# Patient Record
Sex: Male | Born: 1950 | Race: Black or African American | Hispanic: No | Marital: Single | Smoking: Current some day smoker
Health system: Southern US, Community
[De-identification: ages and names within clinical notes are randomized; demographics above are authoritative.]

## PROBLEM LIST (undated history)

## (undated) DIAGNOSIS — I639 Cerebral infarction, unspecified: Secondary | ICD-10-CM

## (undated) DIAGNOSIS — I10 Essential (primary) hypertension: Secondary | ICD-10-CM

---

## 2008-08-17 ENCOUNTER — Encounter: Admission: RE | Admit: 2008-08-17 | Discharge: 2008-09-19 | Payer: Self-pay | Admitting: *Deleted

## 2008-10-17 ENCOUNTER — Encounter: Admission: RE | Admit: 2008-10-17 | Discharge: 2008-11-29 | Payer: Self-pay | Admitting: *Deleted

## 2012-10-21 ENCOUNTER — Ambulatory Visit: Payer: Non-veteran care | Attending: Family | Admitting: Physical Therapy

## 2012-10-21 DIAGNOSIS — R269 Unspecified abnormalities of gait and mobility: Secondary | ICD-10-CM | POA: Insufficient documentation

## 2012-10-21 DIAGNOSIS — IMO0001 Reserved for inherently not codable concepts without codable children: Secondary | ICD-10-CM | POA: Insufficient documentation

## 2012-10-21 DIAGNOSIS — M6281 Muscle weakness (generalized): Secondary | ICD-10-CM | POA: Insufficient documentation

## 2012-10-26 ENCOUNTER — Ambulatory Visit: Payer: Non-veteran care | Admitting: Physical Therapy

## 2012-10-28 ENCOUNTER — Ambulatory Visit: Payer: Non-veteran care | Admitting: Physical Therapy

## 2012-11-02 ENCOUNTER — Ambulatory Visit: Payer: Non-veteran care | Attending: *Deleted

## 2012-11-02 DIAGNOSIS — R269 Unspecified abnormalities of gait and mobility: Secondary | ICD-10-CM | POA: Insufficient documentation

## 2012-11-02 DIAGNOSIS — M6281 Muscle weakness (generalized): Secondary | ICD-10-CM | POA: Insufficient documentation

## 2012-11-02 DIAGNOSIS — IMO0001 Reserved for inherently not codable concepts without codable children: Secondary | ICD-10-CM | POA: Insufficient documentation

## 2012-11-04 ENCOUNTER — Ambulatory Visit: Payer: Non-veteran care | Admitting: Physical Therapy

## 2012-11-07 ENCOUNTER — Ambulatory Visit: Payer: Non-veteran care | Admitting: Physical Therapy

## 2012-11-09 ENCOUNTER — Ambulatory Visit: Payer: Non-veteran care | Admitting: Physical Therapy

## 2012-11-15 ENCOUNTER — Ambulatory Visit: Payer: Non-veteran care | Admitting: Physical Therapy

## 2012-11-18 ENCOUNTER — Ambulatory Visit: Payer: Non-veteran care | Admitting: Physical Therapy

## 2017-03-15 ENCOUNTER — Emergency Department (HOSPITAL_COMMUNITY): Payer: No Typology Code available for payment source

## 2017-03-15 ENCOUNTER — Emergency Department (HOSPITAL_COMMUNITY)
Admission: EM | Admit: 2017-03-15 | Discharge: 2017-03-15 | Disposition: A | Discharge status: Home / Self Care | Payer: No Typology Code available for payment source | Attending: Emergency Medicine | Admitting: Emergency Medicine

## 2017-03-15 ENCOUNTER — Other Ambulatory Visit: Payer: Self-pay

## 2017-03-15 ENCOUNTER — Encounter (HOSPITAL_COMMUNITY): Payer: Self-pay

## 2017-03-15 DIAGNOSIS — Y9389 Activity, other specified: Secondary | ICD-10-CM | POA: Diagnosis not present

## 2017-03-15 DIAGNOSIS — M545 Low back pain, unspecified: Secondary | ICD-10-CM

## 2017-03-15 DIAGNOSIS — Y998 Other external cause status: Secondary | ICD-10-CM | POA: Diagnosis not present

## 2017-03-15 DIAGNOSIS — M7918 Myalgia, other site: Secondary | ICD-10-CM

## 2017-03-15 DIAGNOSIS — F17200 Nicotine dependence, unspecified, uncomplicated: Secondary | ICD-10-CM | POA: Diagnosis not present

## 2017-03-15 DIAGNOSIS — M542 Cervicalgia: Secondary | ICD-10-CM | POA: Insufficient documentation

## 2017-03-15 DIAGNOSIS — I10 Essential (primary) hypertension: Secondary | ICD-10-CM | POA: Insufficient documentation

## 2017-03-15 DIAGNOSIS — Z8673 Personal history of transient ischemic attack (TIA), and cerebral infarction without residual deficits: Secondary | ICD-10-CM | POA: Diagnosis not present

## 2017-03-15 HISTORY — DX: Cerebral infarction, unspecified: I63.9

## 2017-03-15 HISTORY — DX: Essential (primary) hypertension: I10

## 2017-03-15 MED ORDER — HYDROCODONE-ACETAMINOPHEN 5-325 MG PO TABS: 1.0000 | ORAL_TABLET | ORAL | 0 refills | Status: DC | PRN
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
## 2017-03-15 NOTE — Discharge Instructions (Signed)
Your imaging today showed no evidence of fracture or dislocations.  We suspect you are having a muscular type pain.  Please use the pain medicine to help with your symptoms and follow-up with your PCP in several days.  If any symptoms change or worsen, please return to the nearest emergency department.
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
## 2017-03-15 NOTE — ED Notes (Signed)
Patient transported to X-ray 
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
## 2017-03-15 NOTE — ED Notes (Signed)
Patient transported to CT 

## 2017-03-15 NOTE — ED Notes (Signed)
Pt stable, ambulatory, states understanding of discharge instructions 

## 2017-05-19 ENCOUNTER — Emergency Department (HOSPITAL_COMMUNITY): Payer: Medicare HMO

## 2017-05-19 ENCOUNTER — Encounter (HOSPITAL_COMMUNITY): Payer: Self-pay

## 2017-05-19 ENCOUNTER — Emergency Department (HOSPITAL_COMMUNITY)
Admission: EM | Admit: 2017-05-19 | Discharge: 2017-05-19 | Disposition: A | Discharge status: Home / Self Care | Payer: Medicare HMO | Attending: Emergency Medicine | Admitting: Emergency Medicine

## 2017-05-19 ENCOUNTER — Other Ambulatory Visit: Payer: Self-pay

## 2017-05-19 DIAGNOSIS — F17200 Nicotine dependence, unspecified, uncomplicated: Secondary | ICD-10-CM | POA: Insufficient documentation

## 2017-05-19 DIAGNOSIS — R1032 Left lower quadrant pain: Secondary | ICD-10-CM | POA: Diagnosis present

## 2017-05-19 DIAGNOSIS — N201 Calculus of ureter: Secondary | ICD-10-CM | POA: Diagnosis not present

## 2017-05-19 LAB — COMPREHENSIVE METABOLIC PANEL
ALT: 22 U/L (ref 17–63)
AST: 21 U/L (ref 15–41)
Albumin: 3.8 g/dL (ref 3.5–5.0)
Alkaline Phosphatase: 73 U/L (ref 38–126)
Anion gap: 9 (ref 5–15)
BILIRUBIN TOTAL: 0.6 mg/dL (ref 0.3–1.2)
BUN: 16 mg/dL (ref 6–20)
CO2: 21 mmol/L — ABNORMAL LOW (ref 22–32)
Calcium: 9.3 mg/dL (ref 8.9–10.3)
Chloride: 106 mmol/L (ref 101–111)
Creatinine, Ser: 1.57 mg/dL — ABNORMAL HIGH (ref 0.61–1.24)
GFR calc Af Amer: 51 mL/min — ABNORMAL LOW (ref >60)
GFR, EST NON AFRICAN AMERICAN: 44 mL/min — AB (ref >60)
Glucose, Bld: 112 mg/dL — ABNORMAL HIGH (ref 65–99)
POTASSIUM: 5.1 mmol/L (ref 3.5–5.1)
Sodium: 136 mmol/L (ref 135–145)
TOTAL PROTEIN: 7.6 g/dL (ref 6.5–8.1)

## 2017-05-19 LAB — CBC
HEMATOCRIT: 40.6 % (ref 39.0–52.0)
Hemoglobin: 13.7 g/dL (ref 13.0–17.0)
MCH: 27.8 pg (ref 26.0–34.0)
MCHC: 33.7 g/dL (ref 30.0–36.0)
MCV: 82.4 fL (ref 78.0–100.0)
PLATELETS: 189 10*3/uL (ref 150–400)
RBC: 4.93 MIL/uL (ref 4.22–5.81)
RDW: 14.6 % (ref 11.5–15.5)
WBC: 8.4 10*3/uL (ref 4.0–10.5)

## 2017-05-19 LAB — URINALYSIS, ROUTINE W REFLEX MICROSCOPIC
BILIRUBIN URINE: NEGATIVE
GLUCOSE, UA: NEGATIVE mg/dL
Ketones, ur: NEGATIVE mg/dL
LEUKOCYTES UA: NEGATIVE
NITRITE: NEGATIVE
PH: 6 (ref 5.0–8.0)
Protein, ur: NEGATIVE mg/dL
SPECIFIC GRAVITY, URINE: 1.019 (ref 1.005–1.030)
Squamous Epithelial / LPF: NONE SEEN

## 2017-05-19 LAB — LIPASE, BLOOD: Lipase: 36 U/L (ref 11–51)

## 2017-05-19 MED ORDER — HYDROCODONE-ACETAMINOPHEN 5-325 MG PO TABS: 1.0000 | ORAL_TABLET | ORAL | 0 refills | Status: AC | PRN

## 2017-05-19 MED ORDER — ONDANSETRON HCL 4 MG/2ML IJ SOLN
4.0000 mg | Freq: Once | INTRAMUSCULAR | Status: AC
  Administered 2017-05-19: 4 mg via INTRAVENOUS
  Filled 2017-05-19: qty 2

## 2017-05-19 MED ORDER — SODIUM CHLORIDE 0.9 % IV BOLUS (SEPSIS)
500.0000 mL | Freq: Once | INTRAVENOUS | Status: AC
  Administered 2017-05-19: 500 mL via INTRAVENOUS

## 2017-05-19 MED ORDER — ONDANSETRON 8 MG PO TBDP: 8.0000 mg | ORAL_TABLET | Freq: Three times a day (TID) | ORAL | 0 refills | Status: AC | PRN

## 2017-05-19 MED ORDER — KETOROLAC TROMETHAMINE 30 MG/ML IJ SOLN
15.0000 mg | Freq: Once | INTRAMUSCULAR | Status: AC
  Administered 2017-05-19: 15 mg via INTRAVENOUS
  Filled 2017-05-19: qty 1

## 2017-05-19 NOTE — ED Triage Notes (Signed)
Pt presents to the ed with complaints of abdominal pain and nausea since this am. Pt has baseline left side weakness from previous stroke.

## 2017-05-19 NOTE — Discharge Instructions (Signed)
Please review the discharge instructions.  Take the medications as prescribed.  Follow-up with a urologist if your symptoms have not resolved in the next few days.

## 2017-05-19 NOTE — ED Provider Notes (Signed)
[facility] EMERGENCY DEPARTMENT Provider Note   CSN: [id number] Arrival date & time: 05/19/17  1124     History   Chief Complaint Chief Complaint  Patient presents with  . Abdominal Pain    HPI Andrew Calhoun is a 67 y.o. male.  HPI Pt started having pain in the abdomen when he woke up this am.  It is in the middle part of his lower abdomen and on the left side.  The pain is constant and aching.  The pain doesn't hurt when he urinates but when he tries to stop.  He has not been urinating more than usual and it dribbles out.  He wonders if he has an infection.  No vomiting or diarrhea but he does have nausea.   Past Medical History:  Diagnosis Date  . CVA (cerebral vascular accident) (HCC)   . Hypertension     There are no active problems to display for this patient.   History reviewed. No pertinent surgical history.     Home Medications    Prior to Admission medications   Medication Sig Start Date End Date Taking? Authorizing Provider  HYDROcodone-acetaminophen (NORCO/VICODIN) 5-325 MG tablet Take 1 tablet by mouth every 4 (four) hours as needed. 05/19/17   Linwood Dibbles, MD  ondansetron (ZOFRAN ODT) 8 MG disintegrating tablet Take 1 tablet (8 mg total) by mouth every 8 (eight) hours as needed for nausea or vomiting. 05/19/17   Linwood Dibbles, MD    Family History No family history on file.  Social History Social History   Tobacco Use  . Smoking status: Current Some Day Smoker  . Smokeless tobacco: Never Used  Substance Use Topics  . Alcohol use: No    Frequency: Never  . Drug use: No     Allergies   Patient has no known allergies.   Review of Systems Review of Systems  Constitutional: Positive for chills. Negative for fever.  Gastrointestinal: Positive for abdominal pain.  Genitourinary: Positive for dysuria.  All other systems reviewed and are negative.    Physical Exam Updated Vital Signs BP (!) 143/88   Pulse 85   Temp 98.6 F  (37 C) (Oral)   Resp 17   Wt 95.7 kg (211 lb)   SpO2 98%   BMI 34.06 kg/m   Physical Exam  Constitutional: He appears well-developed and well-nourished. No distress.  HENT:  Head: Normocephalic and atraumatic.  Right Ear: External ear normal.  Left Ear: External ear normal.  Eyes: Conjunctivae are normal. Right eye exhibits no discharge. Left eye exhibits no discharge. No scleral icterus.  Neck: Neck supple. No tracheal deviation present.  Cardiovascular: Normal rate, regular rhythm and intact distal pulses.  Pulmonary/Chest: Effort normal and breath sounds normal. No stridor. No respiratory distress. He has no wheezes. He has no rales.  Abdominal: Soft. Bowel sounds are normal. He exhibits no distension. There is tenderness (mild) in the suprapubic area and left lower quadrant. There is CVA tenderness. There is no rebound and no guarding.  Musculoskeletal: He exhibits no edema or tenderness.  Neurological: He is alert. He has normal strength. No cranial nerve deficit (no facial droop, extraocular movements intact, no slurred speech) or sensory deficit. He exhibits normal muscle tone. He displays no seizure activity. Coordination normal.  Skin: Skin is warm and dry. No rash noted.  Psychiatric: He has a normal mood and affect.  Nursing note and vitals reviewed.    ED Treatments / Results  Labs (all labs ordered  are listed, but only abnormal results are displayed) Labs Reviewed  COMPREHENSIVE METABOLIC PANEL - Abnormal; Notable for the following components:      Result Value   CO2 21 (*)    Glucose, Bld 112 (*)    Creatinine, Ser 1.57 (*)    GFR calc non Af Amer 44 (*)    GFR calc Af Amer 51 (*)    All other components within normal limits  URINALYSIS, ROUTINE W REFLEX MICROSCOPIC - Abnormal; Notable for the following components:   Hgb urine dipstick MODERATE (*)    Bacteria, UA RARE (*)    All other components within normal limits  LIPASE, BLOOD  CBC       Radiology Ct Renal Stone Study  Result Date: 05/19/2017 CLINICAL DATA:  LEFT flank pain, hematuria, suspected stone disease EXAM: CT ABDOMEN AND PELVIS WITHOUT CONTRAST TECHNIQUE: Multidetector CT imaging of the abdomen and pelvis was performed following the standard protocol without IV contrast. Sagittal and coronal MPR images reconstructed from axial data set. Oral contrast was not administered. COMPARISON:  None FINDINGS: Lower chest: Minimal dependent density at posterior RIGHT lung base Hepatobiliary: Gallbladder and liver normal appearance Pancreas: Normal appearance Spleen: Normal appearance Adrenals/Urinary Tract: Adrenal glands and RIGHT kidney normal appearance. LEFT hydronephrosis and hydroureter secondary to a 1-2 mm LEFT UVJ calculus. Kidneys and bladder otherwise normal appearance. Stomach/Bowel: Normal appendix. Question rectal wall thickening versus artifact from underdistention. Stomach and bowel loops otherwise unremarkable for technique. Vascular/Lymphatic: Extensive atherosclerotic calcifications aorta, iliac, and femoral arteries. Aorta normal caliber. Significant coronary arterial calcifications. Scattered pelvic phleboliths. No adenopathy. Reproductive: Unremarkable Other: Umbilical hernia containing fat.  No free air or free fluid. Musculoskeletal: No acute osseous findings. IMPRESSION: LEFT hydronephrosis and hydroureter secondary to a 1-2 mm LEFT UVJ calculus. Extensive coronary artery calcifications. Umbilical hernia containing fat. Questionable rectal wall thickening versus artifact from underdistention; consider correlation with proctoscopy. Aortic Atherosclerosis (ICD10-I70.0). Electronically Signed   By: Ulyses Southward M.D.   On: 05/19/2017 16:37    Procedures Procedures (including critical care time)  Medications Ordered in ED Medications  ketorolac (TORADOL) 30 MG/ML injection 15 mg (15 mg Intravenous Given 05/19/17 1549)  ondansetron (ZOFRAN) injection 4 mg (4 mg  Intravenous Given 05/19/17 1549)  sodium chloride 0.9 % bolus 500 mL (0 mLs Intravenous Stopped 05/19/17 1617)     Initial Impression / Assessment and Plan / ED Course  I have reviewed the triage vital signs and the nursing notes.  Pertinent labs & imaging results that were available during my care of the patient were reviewed by me and considered in my medical decision making (see chart for details).   Patient presented to the emergency room with complaints of lower abdominal pain and urinary frequency.  His laboratory tests were notable for mild elevation in his creatinine as well as hematuria.  Urinalysis did not suggest a urinary tract infection.  CT scan was performed demonstrating a 1-2 mm left ureteral stone.  Patient was treated with a low dose of Toradol and ondansetron.  His symptoms have improved.  Discussed the findings with the patient.  Plan on outpatient treatment with urology follow-up.  Final Clinical Impressions(s) / ED Diagnoses   Final diagnoses:  Left ureteral stone    ED Discharge Orders        Ordered    HYDROcodone-acetaminophen (NORCO/VICODIN) 5-325 MG tablet  Every 4 hours PRN     05/19/17 1744    ondansetron (ZOFRAN ODT) 8 MG disintegrating tablet  Every  8 hours PRN     05/19/17 1744       Linwood DibblesKnapp, Ilyse Tremain, MD 05/19/17 [id number]

## 2017-05-19 NOTE — ED Notes (Signed)
Pt discharged from ED; instructions provided and scripts given; Pt encouraged to return to ED if symptoms worsen and to f/u with PCP; Pt verbalized understanding of all instructions 

## 2017-05-21 ENCOUNTER — Telehealth: Payer: Self-pay | Admitting: *Deleted

## 2017-05-21 NOTE — Telephone Encounter (Signed)
Pharmacy called for diagnosis of pt before filling narcotic.

## 2018-07-12 ENCOUNTER — Telehealth: Payer: Self-pay | Admitting: General Practice

## 2018-07-12 NOTE — Telephone Encounter (Signed)
[facility], [facility] will [facility] for CNA home care aid to assist him with house cleaning, personal assistant . Pt stated " no further assistance with the paperwork , [facility] " .

## 2018-07-12 NOTE — Telephone Encounter (Signed)
Patient called and is trying to establish with Dr Ashley Royalty, He said he needs in-home care and needs paperwork filled out, he said he doesn't have access to anything to be able to do a video visit, how do you want to handle this?

## 2018-07-12 NOTE — Telephone Encounter (Signed)
What does he mean by in home care?  Would he be able to come into the office for a visit?  It also appears he is seen at the [place], does he have a primary there?
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
## 2018-08-30 DIAGNOSIS — R6889 Other general symptoms and signs: Secondary | ICD-10-CM | POA: Diagnosis not present

## 2018-11-02 DIAGNOSIS — R6889 Other general symptoms and signs: Secondary | ICD-10-CM | POA: Diagnosis not present

## 2018-11-22 DIAGNOSIS — R6889 Other general symptoms and signs: Secondary | ICD-10-CM | POA: Diagnosis not present

## 2019-01-03 DIAGNOSIS — R6889 Other general symptoms and signs: Secondary | ICD-10-CM | POA: Diagnosis not present

## 2019-03-20 DIAGNOSIS — R6889 Other general symptoms and signs: Secondary | ICD-10-CM | POA: Diagnosis not present

## 2019-07-25 DIAGNOSIS — R6889 Other general symptoms and signs: Secondary | ICD-10-CM | POA: Diagnosis not present

## 2020-05-08 DIAGNOSIS — R6889 Other general symptoms and signs: Secondary | ICD-10-CM | POA: Diagnosis not present

## 2022-11-18 ENCOUNTER — Other Ambulatory Visit: Payer: Self-pay

## 2022-11-18 ENCOUNTER — Emergency Department (HOSPITAL_COMMUNITY)
Admission: EM | Admit: 2022-11-18 | Discharge: 2022-11-19 | Disposition: A | Discharge status: Home / Self Care | Payer: No Typology Code available for payment source | Attending: Emergency Medicine | Admitting: Emergency Medicine

## 2022-11-18 ENCOUNTER — Emergency Department (HOSPITAL_COMMUNITY): Payer: No Typology Code available for payment source

## 2022-11-18 ENCOUNTER — Encounter (HOSPITAL_COMMUNITY): Payer: Self-pay | Admitting: Emergency Medicine

## 2022-11-18 DIAGNOSIS — I10 Essential (primary) hypertension: Secondary | ICD-10-CM | POA: Insufficient documentation

## 2022-11-18 DIAGNOSIS — N289 Disorder of kidney and ureter, unspecified: Secondary | ICD-10-CM | POA: Insufficient documentation

## 2022-11-18 DIAGNOSIS — R102 Pelvic and perineal pain: Secondary | ICD-10-CM

## 2022-11-18 DIAGNOSIS — Z79899 Other long term (current) drug therapy: Secondary | ICD-10-CM | POA: Insufficient documentation

## 2022-11-18 DIAGNOSIS — N200 Calculus of kidney: Secondary | ICD-10-CM | POA: Insufficient documentation

## 2022-11-18 DIAGNOSIS — R339 Retention of urine, unspecified: Secondary | ICD-10-CM | POA: Diagnosis present

## 2022-11-18 LAB — CBC WITH DIFFERENTIAL/PLATELET
Abs Immature Granulocytes: 0.01 10*3/uL (ref 0.00–0.07)
Basophils Absolute: 0.1 10*3/uL (ref 0.0–0.1)
Basophils Relative: 1 %
Eosinophils Absolute: 0.3 10*3/uL (ref 0.0–0.5)
Eosinophils Relative: 5 %
HCT: 41.9 % (ref 39.0–52.0)
Hemoglobin: 14 g/dL (ref 13.0–17.0)
Immature Granulocytes: 0 %
Lymphocytes Relative: 31 %
Lymphs Abs: 1.6 10*3/uL (ref 0.7–4.0)
MCH: 28.8 pg (ref 26.0–34.0)
MCHC: 33.4 g/dL (ref 30.0–36.0)
MCV: 86.2 fL (ref 80.0–100.0)
Monocytes Absolute: 0.6 10*3/uL (ref 0.1–1.0)
Monocytes Relative: 11 %
Neutro Abs: 2.7 10*3/uL (ref 1.7–7.7)
Neutrophils Relative %: 52 %
Platelets: 139 10*3/uL — ABNORMAL LOW (ref 150–400)
RBC: 4.86 MIL/uL (ref 4.22–5.81)
RDW: 14.6 % (ref 11.5–15.5)
WBC: 5.2 10*3/uL (ref 4.0–10.5)
nRBC: 0 % (ref 0.0–0.2)

## 2022-11-18 LAB — URINALYSIS, ROUTINE W REFLEX MICROSCOPIC
Bacteria, UA: NONE SEEN
Bilirubin Urine: NEGATIVE
Glucose, UA: NEGATIVE mg/dL
Hgb urine dipstick: NEGATIVE
Ketones, ur: 5 mg/dL — AB
Leukocytes,Ua: NEGATIVE
Nitrite: NEGATIVE
Protein, ur: 30 mg/dL — AB
Specific Gravity, Urine: 1.026 (ref 1.005–1.030)
pH: 5 (ref 5.0–8.0)

## 2022-11-18 LAB — COMPREHENSIVE METABOLIC PANEL
ALT: 15 U/L (ref 0–44)
AST: 22 U/L (ref 15–41)
Albumin: 3.8 g/dL (ref 3.5–5.0)
Alkaline Phosphatase: 77 U/L (ref 38–126)
Anion gap: 11 (ref 5–15)
BUN: 15 mg/dL (ref 8–23)
CO2: 20 mmol/L — ABNORMAL LOW (ref 22–32)
Calcium: 9.3 mg/dL (ref 8.9–10.3)
Chloride: 109 mmol/L (ref 98–111)
Creatinine, Ser: 1.5 mg/dL — ABNORMAL HIGH (ref 0.61–1.24)
GFR, Estimated: 49 mL/min — ABNORMAL LOW (ref >60)
Glucose, Bld: 119 mg/dL — ABNORMAL HIGH (ref 70–99)
Potassium: 3.8 mmol/L (ref 3.5–5.1)
Sodium: 140 mmol/L (ref 135–145)
Total Bilirubin: 0.4 mg/dL (ref 0.3–1.2)
Total Protein: 7.4 g/dL (ref 6.5–8.1)

## 2022-11-18 NOTE — ED Triage Notes (Signed)
Patient reports he has kidney stones and his urine is brown along with not emptying bladder fully.  Patient seen at [facility] and diagnosed with all of this.  Patient also endorses abd pain.

## 2022-11-19 NOTE — Discharge Instructions (Signed)
Stop taking tamsulosin, it may be causing some of your problems with urination.  Please follow-up with your urologist.  If symptoms do not improve over the next several days, they may want to start medicine to help with bladder spasms.  Return if you develop fever, vomiting, or severe pain.

## 2022-11-19 NOTE — ED Provider Notes (Signed)
[place] EMERGENCY DEPARTMENT AT [facility] Provider Note   CSN: [id number] Arrival date & time: 11/18/22  2149     History  Chief Complaint  Patient presents with   Urinary Retention    Andrew Calhoun is a 72 y.o. male.  The history is provided by the patient.  He has history of hypertension, stroke, and was recently treated by his urologist for kidney stones and started on tamsulosin.  Since then, he has noted urinary urgency and frequency and notices that he only urinates a very small amount.  He states at times he has had urge incontinence.  He is also complaining of some pain in the suprapubic area and excess flatus.  He denies fever or chills.  He denies any flank pain.  He states his urine has turned brownish and has an odor to it.   Home Medications Prior to Admission medications   Medication Sig Start Date End Date Taking? Authorizing Provider  HYDROcodone-acetaminophen (NORCO/VICODIN) 5-325 MG tablet Take 1 tablet by mouth every 4 (four) hours as needed. 05/19/17   Linwood Dibbles, MD  ondansetron (ZOFRAN ODT) 8 MG disintegrating tablet Take 1 tablet (8 mg total) by mouth every 8 (eight) hours as needed for nausea or vomiting. 05/19/17   Linwood Dibbles, MD      Allergies    Patient has no known allergies.    Review of Systems   Review of Systems  All other systems reviewed and are negative.   Physical Exam Updated Vital Signs BP 121/78   Pulse (!) 59   Temp 98.2 F (36.8 C) (Oral)   Resp 18   Wt 96.2 kg   SpO2 92%   BMI 34.22 kg/m  Physical Exam Vitals and nursing note reviewed.   72 year old male, resting comfortably and in no acute distress. Vital signs are significant for borderline slow heart rate. Oxygen saturation is 92%, which is normal. Head is normocephalic and atraumatic. PERRLA, EOMI. Oropharynx is clear. Neck is nontender and supple. Back is nontender and there is no CVA tenderness. Lungs are clear without rales, wheezes, or rhonchi. Chest  is nontender. Heart has regular rate and rhythm without murmur. Abdomen is soft, flat, with mild suprapubic tenderness.  There is no rebound or guarding. Extremities have no cyanosis or edema, full range of motion is present. Skin is warm and dry without rash. Neurologic: Mental status is normal, cranial nerves are intact, moves all extremities equally.  ED Results / Procedures / Treatments   Labs (all labs ordered are listed, but only abnormal results are displayed) Labs Reviewed  CBC WITH DIFFERENTIAL/PLATELET - Abnormal; Notable for the following components:      Result Value   Platelets 139 (*)    All other components within normal limits  COMPREHENSIVE METABOLIC PANEL - Abnormal; Notable for the following components:   CO2 20 (*)    Glucose, Bld 119 (*)    Creatinine, Ser 1.50 (*)    GFR, Estimated 49 (*)    All other components within normal limits  URINALYSIS, ROUTINE W REFLEX MICROSCOPIC - Abnormal; Notable for the following components:   Ketones, ur 5 (*)    Protein, ur 30 (*)    All other components within normal limits   Radiology CT Renal Stone Study  Result Date: 11/18/2022 CLINICAL DATA:  Abdominal/flank pain, urinary retention EXAM: CT ABDOMEN AND PELVIS WITHOUT CONTRAST TECHNIQUE: Multidetector CT imaging of the abdomen and pelvis was performed following the standard protocol without IV  contrast. RADIATION DOSE REDUCTION: This exam was performed according to the departmental dose-optimization program which includes automated exposure control, adjustment of the mA and/or kV according to patient size and/or use of iterative reconstruction technique. COMPARISON:  05/19/2017 FINDINGS: Lower chest: Lung bases are clear. Hepatobiliary: Unenhanced liver is unremarkable. Gallbladder sludge versus noncalcified gallstones (series 2/image 28), without associated inflammatory changes. No intrahepatic or extrahepatic duct dilatation. Pancreas: Within normal limits. Spleen: Within  normal limits. Adrenals/Urinary Tract: Adrenal glands are within normal limits. 4 mm nonobstructing right upper pole renal calculus (series 2/image 28). Left kidney is within normal limits. No hydronephrosis. Bladder is underdistended and poorly evaluated. Stomach/Bowel: Stomach is within normal limits. No evidence of bowel obstruction. Normal appendix (series 2/52). Scattered colonic diverticulosis, without evidence of diverticulitis. Vascular/Lymphatic: No evidence of abdominal aortic aneurysm. Atherosclerotic calcifications of the abdominal aorta and branch vessels. No suspicious abdominopelvic lymphadenopathy. Reproductive: Prostate is unremarkable. Other: No abdominopelvic ascites. Musculoskeletal: Degenerative changes of the visualized thoracolumbar spine. IMPRESSION: 4 mm nonobstructing right upper pole renal calculus. No hydronephrosis. Gallbladder sludge versus noncalcified gallstones, without associated inflammatory changes. Scattered colonic diverticulosis, without evidence of diverticulitis. Electronically Signed   By: Charline Bills M.D.   On: 11/18/2022 23:48    Procedures Procedures    Medications Ordered in ED Medications - No data to display  ED Course/ Medical Decision Making/ A&P                                 Medical Decision Making Amount and/or Complexity of Data Reviewed Labs: ordered. Radiology: ordered.   Urinary symptoms and lower abdominal pain in patient with recent history of kidney stone.  Consider renal colic from kidney stone, urinary tract infection, diverticulitis.  Doubt appendicitis.  I have reviewed his laboratory test, and my interpretation is stable renal insufficiency and stable elevated random glucose level, normal WBC and WBC differential, normal hemoglobin, mild thrombocytopenia which is not felt to be clinically significant.  Urinalysis is significant for mild proteinuria but no evidence of UTI, only 0-5 RBCs.  CT scan shows 4 mm nonobstructing right  upper pole renal calculus with no ureterolithiasis or hydronephrosis, diverticulosis without diverticulitis, gallbladder sludge without evidence of inflammation.  Bladder is noted to be contracted.  I have independently viewed the images, and agree with radiologist's interpretation.  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  Since symptoms started following initiation of tamsulosin, I am suspicious that that is causing his symptoms.  I am recommending that he discontinue tamsulosin and follow-up with his urologist.  He is reassured that there is no evidence of acute pathology based on labs and urinalysis and CT scan.  I have given him strict return precautions.  Final Clinical Impression(s) / ED Diagnoses Final diagnoses:  Suprapubic pain  Renal insufficiency  Right nephrolithiasis    Rx / DC Orders ED Discharge Orders     None         Dione Booze, MD 11/19/22 [phone]

## 2023-02-28 ENCOUNTER — Emergency Department (HOSPITAL_COMMUNITY)
Admission: EM | Admit: 2023-02-28 | Discharge: 2023-02-28 | Disposition: A | Discharge status: Home / Self Care | Payer: No Typology Code available for payment source | Attending: Emergency Medicine | Admitting: Emergency Medicine

## 2023-02-28 ENCOUNTER — Emergency Department (HOSPITAL_COMMUNITY): Payer: No Typology Code available for payment source

## 2023-02-28 ENCOUNTER — Encounter (HOSPITAL_COMMUNITY): Payer: Self-pay

## 2023-02-28 ENCOUNTER — Other Ambulatory Visit: Payer: Self-pay

## 2023-02-28 DIAGNOSIS — R531 Weakness: Secondary | ICD-10-CM | POA: Diagnosis not present

## 2023-02-28 DIAGNOSIS — D696 Thrombocytopenia, unspecified: Secondary | ICD-10-CM | POA: Insufficient documentation

## 2023-02-28 DIAGNOSIS — M79662 Pain in left lower leg: Secondary | ICD-10-CM

## 2023-02-28 DIAGNOSIS — I10 Essential (primary) hypertension: Secondary | ICD-10-CM | POA: Insufficient documentation

## 2023-02-28 DIAGNOSIS — M25572 Pain in left ankle and joints of left foot: Secondary | ICD-10-CM | POA: Insufficient documentation

## 2023-02-28 DIAGNOSIS — M79605 Pain in left leg: Secondary | ICD-10-CM | POA: Diagnosis not present

## 2023-02-28 LAB — BASIC METABOLIC PANEL
Anion gap: 12 (ref 5–15)
BUN: 17 mg/dL (ref 8–23)
CO2: 21 mmol/L — ABNORMAL LOW (ref 22–32)
Calcium: 9.4 mg/dL (ref 8.9–10.3)
Chloride: 104 mmol/L (ref 98–111)
Creatinine, Ser: 1.56 mg/dL — ABNORMAL HIGH (ref 0.61–1.24)
GFR, Estimated: 47 mL/min — ABNORMAL LOW (ref >60)
Glucose, Bld: 151 mg/dL — ABNORMAL HIGH (ref 70–99)
Potassium: 4.3 mmol/L (ref 3.5–5.1)
Sodium: 137 mmol/L (ref 135–145)

## 2023-02-28 LAB — CBC WITH DIFFERENTIAL/PLATELET
Abs Immature Granulocytes: 0.02 10*3/uL (ref 0.00–0.07)
Basophils Absolute: 0 10*3/uL (ref 0.0–0.1)
Basophils Relative: 1 %
Eosinophils Absolute: 0.1 10*3/uL (ref 0.0–0.5)
Eosinophils Relative: 2 %
HCT: 44.6 % (ref 39.0–52.0)
Hemoglobin: 14.4 g/dL (ref 13.0–17.0)
Immature Granulocytes: 0 %
Lymphocytes Relative: 15 %
Lymphs Abs: 1 10*3/uL (ref 0.7–4.0)
MCH: 27.6 pg (ref 26.0–34.0)
MCHC: 32.3 g/dL (ref 30.0–36.0)
MCV: 85.4 fL (ref 80.0–100.0)
Monocytes Absolute: 0.7 10*3/uL (ref 0.1–1.0)
Monocytes Relative: 11 %
Neutro Abs: 4.5 10*3/uL (ref 1.7–7.7)
Neutrophils Relative %: 71 %
Platelets: 126 10*3/uL — ABNORMAL LOW (ref 150–400)
RBC: 5.22 MIL/uL (ref 4.22–5.81)
RDW: 15.3 % (ref 11.5–15.5)
WBC: 6.4 10*3/uL (ref 4.0–10.5)
nRBC: 0 % (ref 0.0–0.2)

## 2023-02-28 LAB — CK: Total CK: 121 U/L (ref 49–397)

## 2023-02-28 MED ORDER — METHOCARBAMOL 500 MG PO TABS: 500.0000 mg | ORAL_TABLET | Freq: Two times a day (BID) | ORAL | 0 refills | Status: DC | PRN

## 2023-02-28 MED ORDER — HYDROCODONE-ACETAMINOPHEN 5-325 MG PO TABS
1.0000 | ORAL_TABLET | Freq: Once | ORAL | Status: AC
  Administered 2023-02-28: 1 via ORAL
  Filled 2023-02-28: qty 1

## 2023-02-28 MED ORDER — OXYCODONE-ACETAMINOPHEN 5-325 MG PO TABS
1.0000 | ORAL_TABLET | Freq: Once | ORAL | Status: AC
  Administered 2023-02-28: 1 via ORAL
  Filled 2023-02-28: qty 1

## 2023-02-28 MED ORDER — METHOCARBAMOL 500 MG PO TABS
500.0000 mg | ORAL_TABLET | Freq: Two times a day (BID) | ORAL | 0 refills | Status: DC | PRN
Start: 2023-02-28 — End: 2023-05-17

## 2023-02-28 NOTE — ED Triage Notes (Signed)
Complains of pain to left leg since stroke but had covid and flu shot the other day and since has been having pain in left leg worse. Brace in place due to stroke years ago.

## 2023-02-28 NOTE — ED Provider Notes (Signed)
[place] EMERGENCY DEPARTMENT AT [facility] Provider Note   CSN: [id number] Arrival date & time: 02/28/23  1242     History  Chief Complaint  Patient presents with   Leg Pain    Samari Spilsbury is a 72 y.o. male.   Leg Pain 72 year old male history of prior stroke with left leg weakness and hypertension presenting for left leg pain.  Patient states he has had pain and swelling to his left lateral ankle for several weeks.  His flu shot couple days ago and since that has had worsening pain.  No trauma or fall.  Pain is isolated to the lateral left ankle.  No swelling.  No erythema or fevers.  Is otherwise been at his baseline health.  No new weakness or numbness.     Home Medications Prior to Admission medications   Medication Sig Start Date End Date Taking? Authorizing Provider  HYDROcodone-acetaminophen (NORCO/VICODIN) 5-325 MG tablet Take 1 tablet by mouth every 4 (four) hours as needed. 05/19/17   Linwood Dibbles, MD  methocarbamol (ROBAXIN) 500 MG tablet Take 1 tablet (500 mg total) by mouth 2 (two) times daily as needed for muscle spasms. 02/28/23   Laurence Spates, MD  ondansetron (ZOFRAN ODT) 8 MG disintegrating tablet Take 1 tablet (8 mg total) by mouth every 8 (eight) hours as needed for nausea or vomiting. 05/19/17   Linwood Dibbles, MD      Allergies    Patient has no known allergies.    Review of Systems   Review of Systems Review of systems completed and notable as per HPI.  ROS otherwise negative.   Physical Exam Updated Vital Signs BP 103/69 (BP Location: Right Arm)   Pulse 67   Temp 97.8 F (36.6 C)   Resp 16   Ht 5\' 6"  (1.676 m)   Wt 96.2 kg   SpO2 96%   BMI 34.22 kg/m  Physical Exam Vitals and nursing note reviewed.  Constitutional:      General: He is not in acute distress.    Appearance: He is well-developed.  HENT:     Head: Normocephalic and atraumatic.  Eyes:     Conjunctiva/sclera: Conjunctivae normal.  Cardiovascular:     Rate and  Rhythm: Normal rate and regular rhythm.     Pulses: Normal pulses.     Heart sounds: Normal heart sounds. No murmur heard. Pulmonary:     Effort: Pulmonary effort is normal. No respiratory distress.     Breath sounds: Normal breath sounds.  Abdominal:     Palpations: Abdomen is soft.     Tenderness: There is no abdominal tenderness.  Musculoskeletal:        General: No swelling.     Cervical back: Neck supple.     Comments: Mild tenderness over the lateral aspect of the left ankle with mild swelling of the left ankle and calf.  No skin changes.  No erythema or warmth.  Mild pain with range of motion of left ankle.  Palpable DP and PT pulse.  Sensation is intact.  Baseline weakness.  Skin:    General: Skin is warm and dry.     Capillary Refill: Capillary refill takes less than 2 seconds.  Neurological:     Mental Status: He is alert and oriented to person, place, and time. Mental status is at baseline.     Cranial Nerves: No cranial nerve deficit.     Sensory: No sensory deficit.     Coordination: Coordination normal.  Comments: Baseline weakness in the left leg.  Psychiatric:        Mood and Affect: Mood normal.     ED Results / Procedures / Treatments   Labs (all labs ordered are listed, but only abnormal results are displayed) Labs Reviewed  CBC WITH DIFFERENTIAL/PLATELET - Abnormal; Notable for the following components:      Result Value   Platelets 126 (*)    All other components within normal limits  BASIC METABOLIC PANEL - Abnormal; Notable for the following components:   CO2 21 (*)    Glucose, Bld 151 (*)    Creatinine, Ser 1.56 (*)    GFR, Estimated 47 (*)    All other components within normal limits  CK    EKG None  Radiology VAS [place] LOWER EXTREMITY VENOUS (DVT) (7a-7p) Result Date: 02/28/2023  Lower Venous DVT Study Patient Name:  Timoth Machida  Date of Exam:   02/28/2023 Medical Rec #: [id number]     Accession #:    [id number] Date of Birth: 02-07-1951       Patient Gender: M Patient Age:   50 years Exam Location:  [facility] Procedure:      VAS [place] LOWER EXTREMITY VENOUS (DVT) Referring Phys: Marja Kays Eldred Sooy --------------------------------------------------------------------------------  Indications: Increased pain to left calf since receiving COVID and Flu vaccines 2 days ago. Patient has history of CVA with residual left side weakness and wears brace on left leg.  Limitations: Poor ultrasound/tissue interface in calf area. Comparison Study: No prior study on file Performing Technologist: Sherren Kerns RVS  Examination Guidelines: A complete evaluation includes B-mode imaging, spectral Doppler, color Doppler, and power Doppler as needed of all accessible portions of each vessel. Bilateral testing is considered an integral part of a complete examination. Limited examinations for reoccurring indications may be performed as noted. The reflux portion of the exam is performed with the patient in reverse Trendelenburg.  +-----+---------------+---------+-----------+----------+--------------+ RIGHTCompressibilityPhasicitySpontaneityPropertiesThrombus Aging +-----+---------------+---------+-----------+----------+--------------+ CFV  Full           Yes      Yes                                 +-----+---------------+---------+-----------+----------+--------------+   +---------+---------------+---------+-----------+----------+-------------------+ LEFT     CompressibilityPhasicitySpontaneityPropertiesThrombus Aging      +---------+---------------+---------+-----------+----------+-------------------+ CFV      Full           Yes      Yes                                      +---------+---------------+---------+-----------+----------+-------------------+ SFJ      Full                                                             +---------+---------------+---------+-----------+----------+-------------------+ FV Prox  Full                                                              +---------+---------------+---------+-----------+----------+-------------------+ FV  Mid   Full                                                             +---------+---------------+---------+-----------+----------+-------------------+ FV DistalFull                                                             +---------+---------------+---------+-----------+----------+-------------------+ PFV      Full                                                             +---------+---------------+---------+-----------+----------+-------------------+ POP      Full           Yes      Yes                                      +---------+---------------+---------+-----------+----------+-------------------+ PTV                                                   patent by color     +---------+---------------+---------+-----------+----------+-------------------+ PERO                                                  Not well visualized +---------+---------------+---------+-----------+----------+-------------------+ Patent distal posterior tibial, dorsalis pedis, and peroneal arteries   Summary: RIGHT: - No evidence of common femoral vein obstruction.   LEFT: - There is no evidence of deep vein thrombosis in the lower extremity.  *See table(s) above for measurements and observations.    Preliminary    DG Foot Complete Left Result Date: 02/28/2023 CLINICAL DATA:  Left foot pain.  No known injury. EXAM: LEFT FOOT - COMPLETE 3+ VIEW COMPARISON:  None Available. FINDINGS: There is no evidence of fracture or dislocation. There is no evidence of arthropathy or other focal bone abnormality. Soft tissues are unremarkable. IMPRESSION: Negative. Electronically Signed   By: Danae Orleans M.D.   On: 02/28/2023 15:17   DG Ankle Complete Left Result Date: 02/28/2023 CLINICAL DATA:  Left ankle pain and swelling.  No known injury. EXAM: LEFT ANKLE COMPLETE -  3+ VIEW COMPARISON:  None Available. FINDINGS: There is no evidence of fracture, dislocation, or joint effusion. There is no evidence of arthropathy or other focal bone lesions. Generalized osteopenia noted. Lateral soft tissue swelling is seen. Peripheral vascular calcification also noted. IMPRESSION: Lateral soft tissue swelling. No acute fracture or dislocation. Osteopenia. Peripheral vascular disease. Electronically Signed   By: Danae Orleans M.D.   On: 02/28/2023 15:16    Procedures Procedures    Medications Ordered in ED  Medications  HYDROcodone-acetaminophen (NORCO/VICODIN) 5-325 MG per tablet 1 tablet (has no administration in time range)  oxyCODONE-acetaminophen (PERCOCET/ROXICET) 5-325 MG per tablet 1 tablet (1 tablet Oral Given 02/28/23 1358)    ED Course/ Medical Decision Making/ A&P                                 Medical Decision Making Risk Prescription drug management.   Medical Decision Making:   Raffi Slisz is a 72 y.o. male who presented to the ED today with several weeks of left-sided leg pain and swelling that worsened a couple days ago after getting his shots.  On exam he has baseline weakness, some tenderness over the lateral aspect the ankle and mild swelling here.  He has not had any trauma.  He is no skin changes or signs of cellulitis or septic arthritis or osteomyelitis.  Will evaluate with ultrasound for possible DVT given limited mobility and swelling here.  Labs notable for baseline thrombocytopenia and baseline renal function.  Reviewed and confirmed nursing documentation for past medical history, family history, social history. Reassessment and Plan:   X-ray without fracture.  Ultrasound tech notified that there is no signs of vascular abnormality no signs of DVT or arterial abnormality of the left leg.  On reassessment pain is slightly improved.  Request additional pain medication.  He wants to try muscle relaxer.  I counseled him on the risks of fall  and drowsiness with this.  He would like to try it.  Instructed not to drive while taking this.  He is niece is able to take him home.  Recommend follow-up closely with PCP.  Return precautions given.   Patient's presentation is most consistent with acute complicated illness / injury requiring diagnostic workup.           Final Clinical Impression(s) / ED Diagnoses Final diagnoses:  Acute left ankle pain    Rx / DC Orders ED Discharge Orders          Ordered    methocarbamol (ROBAXIN) 500 MG tablet  2 times daily PRN,   Status:  Discontinued        02/28/23 2010    methocarbamol (ROBAXIN) 500 MG tablet  2 times daily PRN,   Status:  Discontinued        02/28/23 2011    methocarbamol (ROBAXIN) 500 MG tablet  2 times daily PRN        02/28/23 2013              Laurence Spates, MD 02/28/23 2018

## 2023-02-28 NOTE — Progress Notes (Addendum)
VASCULAR LAB    Left lower extremity venous duplex has been performed.  See CV proc for preliminary results.  Gave verbal report to Dr .Ozella Rocks, [facility], RVT 02/28/2023, 7:36 PM

## 2023-02-28 NOTE — ED Provider Triage Note (Cosign Needed)
Emergency Medicine Provider Triage Evaluation Note  Andrew Calhoun , a 72 y.o. male  was evaluated in triage.  Pt complains of L leg pain after getting COVID/flu shot 2 days ago. Is affecting leg that was affected by stroke.   Review of Systems  Positive: Leg pain Negative: fevers  Physical Exam  BP (!) 140/129   Pulse 82   Temp 97.8 F (36.6 C)   Resp 16   SpO2 99%  Gen:   Awake, no distress   Resp:  Normal effort  MSK:   Moves extremities without difficulty  Other:  +TTP of  L ankle and heel  Medical Decision Making  Medically screening exam initiated at 1:43 PM.  Appropriate orders placed.  Ladarren Estime was informed that the remainder of the evaluation will be completed by another provider, this initial triage assessment does not replace that evaluation, and the importance of remaining in the ED until their evaluation is complete.     Pete Pelt, [place] 02/28/23 1344

## 2023-05-17 ENCOUNTER — Emergency Department (HOSPITAL_COMMUNITY)
Admission: EM | Admit: 2023-05-17 | Discharge: 2023-05-18 | Disposition: A | Discharge status: Home / Self Care | Attending: Emergency Medicine | Admitting: Emergency Medicine

## 2023-05-17 ENCOUNTER — Other Ambulatory Visit: Payer: Self-pay

## 2023-05-17 ENCOUNTER — Emergency Department (HOSPITAL_COMMUNITY)

## 2023-05-17 DIAGNOSIS — R531 Weakness: Secondary | ICD-10-CM | POA: Insufficient documentation

## 2023-05-17 DIAGNOSIS — M25562 Pain in left knee: Secondary | ICD-10-CM | POA: Diagnosis not present

## 2023-05-17 DIAGNOSIS — I69398 Other sequelae of cerebral infarction: Secondary | ICD-10-CM | POA: Insufficient documentation

## 2023-05-17 DIAGNOSIS — M62422 Contracture of muscle, left upper arm: Secondary | ICD-10-CM | POA: Diagnosis not present

## 2023-05-17 DIAGNOSIS — I10 Essential (primary) hypertension: Secondary | ICD-10-CM | POA: Insufficient documentation

## 2023-05-17 LAB — CBC WITH DIFFERENTIAL/PLATELET
Abs Immature Granulocytes: 0.03 10*3/uL (ref 0.00–0.07)
Basophils Absolute: 0 10*3/uL (ref 0.0–0.1)
Basophils Relative: 0 %
Eosinophils Absolute: 0.1 10*3/uL (ref 0.0–0.5)
Eosinophils Relative: 1 %
HCT: 42.2 % (ref 39.0–52.0)
Hemoglobin: 13.6 g/dL (ref 13.0–17.0)
Immature Granulocytes: 0 %
Lymphocytes Relative: 13 %
Lymphs Abs: 1.3 10*3/uL (ref 0.7–4.0)
MCH: 27.4 pg (ref 26.0–34.0)
MCHC: 32.2 g/dL (ref 30.0–36.0)
MCV: 85.1 fL (ref 80.0–100.0)
Monocytes Absolute: 1.1 10*3/uL — ABNORMAL HIGH (ref 0.1–1.0)
Monocytes Relative: 12 %
Neutro Abs: 7.3 10*3/uL (ref 1.7–7.7)
Neutrophils Relative %: 74 %
Platelets: 124 10*3/uL — ABNORMAL LOW (ref 150–400)
RBC: 4.96 MIL/uL (ref 4.22–5.81)
RDW: 15.6 % — ABNORMAL HIGH (ref 11.5–15.5)
WBC: 9.9 10*3/uL (ref 4.0–10.5)
nRBC: 0 % (ref 0.0–0.2)

## 2023-05-17 LAB — BASIC METABOLIC PANEL
Anion gap: 10 (ref 5–15)
BUN: 17 mg/dL (ref 8–23)
CO2: 21 mmol/L — ABNORMAL LOW (ref 22–32)
Calcium: 8.6 mg/dL — ABNORMAL LOW (ref 8.9–10.3)
Chloride: 107 mmol/L (ref 98–111)
Creatinine, Ser: 1.33 mg/dL — ABNORMAL HIGH (ref 0.61–1.24)
GFR, Estimated: 57 mL/min — ABNORMAL LOW (ref >60)
Glucose, Bld: 162 mg/dL — ABNORMAL HIGH (ref 70–99)
Potassium: 3.6 mmol/L (ref 3.5–5.1)
Sodium: 138 mmol/L (ref 135–145)

## 2023-05-17 LAB — CK: Total CK: 724 U/L — ABNORMAL HIGH (ref 49–397)

## 2023-05-17 MED ORDER — METHOCARBAMOL 500 MG PO TABS: 500.0000 mg | ORAL_TABLET | Freq: Two times a day (BID) | ORAL | 0 refills | Status: AC

## 2023-05-17 MED ORDER — SODIUM CHLORIDE 0.9 % IV BOLUS
1000.0000 mL | Freq: Once | INTRAVENOUS | Status: AC
  Administered 2023-05-17: 1000 mL via INTRAVENOUS

## 2023-05-17 MED ORDER — METHOCARBAMOL 500 MG PO TABS
500.0000 mg | ORAL_TABLET | Freq: Once | ORAL | Status: AC
  Administered 2023-05-17: 500 mg via ORAL
  Filled 2023-05-17: qty 1

## 2023-05-17 NOTE — ED Provider Notes (Signed)
 Patient continues to ask to be discharged.  His labs have finally resulted.  CK not consistent with rhabdo. BMP about baseline.     Roxy Horseman, PA-C 05/18/23 0450    Sabas Sous, MD 05/18/23 [phone]

## 2023-05-17 NOTE — ED Triage Notes (Signed)
 Patient brought in by [facility] for generalized weakness x 3-4 days. Patient unable to get off couch to ambulate, crawling to bathroom to defecate. Left side deficit from previous stroke, uses cane. No falls, no injury.   Nurse aide sat home feeding and offering patient drink. Alert and oriented x4  136/70 CBG 133

## 2023-05-17 NOTE — Discharge Instructions (Signed)
 Your blood work looked good.  Please be certain to stay hydrated.

## 2023-05-17 NOTE — ED Provider Notes (Signed)
 [place] EMERGENCY DEPARTMENT AT [address] [facility] Provider Note   CSN: [id number] Arrival date & time: 05/17/23  1150     History  Chief Complaint  Patient presents with   Weakness    Andrew Calhoun is a 73 y.o. male.  With history of hypertension, previous CVA with left-sided deficit presenting to the ED for evaluation of generalized weakness.  Symptoms began 3 to 4 days ago.  He has had some difficulty ambulating and says he has been crawling to the bathroom.  He denies any falls or recent injuries.  He states for the past 1.5 days he has not been eating or drinking much.  He states when he tries to get up from the couch, his left leg which is his leg affected by the stroke begins to shake and when he places weight on the leg the leg gives out.  He reports some moderate pain to the left knee.  He denies fevers or chills, cough, chest pain, shortness of breath, abdominal pain.  He reports some urinary frequency but denies dysuria or urgency.   Weakness Associated symptoms: arthralgias        Home Medications Prior to Admission medications   Medication Sig Start Date End Date Taking? Authorizing Provider  HYDROcodone-acetaminophen (NORCO/VICODIN) 5-325 MG tablet Take 1 tablet by mouth every 4 (four) hours as needed. 05/19/17   Linwood Dibbles, MD  methocarbamol (ROBAXIN) 500 MG tablet Take 1 tablet (500 mg total) by mouth 2 (two) times daily as needed for muscle spasms. 02/28/23   Laurence Spates, MD  ondansetron (ZOFRAN ODT) 8 MG disintegrating tablet Take 1 tablet (8 mg total) by mouth every 8 (eight) hours as needed for nausea or vomiting. 05/19/17   Linwood Dibbles, MD      Allergies    Patient has no known allergies.    Review of Systems   Review of Systems  Musculoskeletal:  Positive for arthralgias.  Neurological:  Positive for weakness.  All other systems reviewed and are negative.   Physical Exam Updated Vital Signs BP 128/77   Pulse 86   Temp 97.8 F (36.6 C)  (Oral)   Resp 18   Ht 5\' 4"  (1.626 m)   Wt 87.1 kg   SpO2 98%   BMI 32.96 kg/m  Physical Exam Vitals and nursing note reviewed.  Constitutional:      General: He is not in acute distress.    Appearance: Normal appearance. He is normal weight. He is not ill-appearing.     Comments: Resting comfortably in bed  HENT:     Head: Normocephalic and atraumatic.  Cardiovascular:     Rate and Rhythm: Normal rate and regular rhythm.  Pulmonary:     Effort: Pulmonary effort is normal. No respiratory distress.     Breath sounds: No wheezing, rhonchi or rales.  Abdominal:     General: Abdomen is flat.  Musculoskeletal:        General: Normal range of motion.     Cervical back: Neck supple.     Comments: LUE contracture, LLE with brace.  Mild TTP of left knee without deformity, overlying erythema, joint space laxity.  Compartments are soft.  Skin:    General: Skin is warm and dry.  Neurological:     Mental Status: He is alert and oriented to person, place, and time.  Psychiatric:        Mood and Affect: Mood normal.        Behavior: Behavior normal.  ED Results / Procedures / Treatments   Labs (all labs ordered are listed, but only abnormal results are displayed) Labs Reviewed  BASIC METABOLIC PANEL  CBC WITH DIFFERENTIAL/PLATELET  CK    EKG None  Radiology CT Head Wo Contrast Result Date: 05/17/2023 CLINICAL DATA:  Neuro deficit, concern for stroke. EXAM: CT HEAD WITHOUT CONTRAST TECHNIQUE: Contiguous axial images were obtained from the base of the skull through the vertex without intravenous contrast. RADIATION DOSE REDUCTION: This exam was performed according to the departmental dose-optimization program which includes automated exposure control, adjustment of the mA and/or kV according to patient size and/or use of iterative reconstruction technique. COMPARISON:  None Available. FINDINGS: Brain: No acute intracranial hemorrhage. No CT evidence of acute infarct. Nonspecific  hypoattenuation in the periventricular and subcortical white matter favored to reflect chronic microvascular ischemic changes. Remote infarct involving the right corona radiata and basal ganglia. Additional encephalomalacia in the anterior inferior right frontal lobe, posttraumatic versus prior infarct. Volume loss in the right frontal lobe possibly related to prior infarct. No edema, mass effect, or midline shift. The basilar cisterns are patent. Ventricles: Ex vacuo dilatation of the right lateral ventricle. No hydrocephalus. Vascular: Atherosclerotic calcifications of the carotid siphons. No hyperdense vessel. Skull: No acute or aggressive finding. Orbits: Orbits are symmetric. Sinuses: Scattered mucosal thickening throughout the paranasal sinuses most pronounced in the ethmoid sinuses. No air-fluid levels noted. Other: Mastoid air cells are clear. IMPRESSION: No CT evidence of acute intracranial abnormality. Remote infarct in the right corona radiata and right basal ganglia. Additional encephalomalacia in the anterior inferior right frontal lobe, posttraumatic versus prior infarct. Mild-to-moderate chronic microvascular ischemic changes. Electronically Signed   By: Emily Filbert M.D.   On: 05/17/2023 15:36   DG Chest 1 View Result Date: 05/17/2023 CLINICAL DATA:  MC-DGpain, inability to bear weight, weakness EXAM: CHEST  1 VIEW COMPARISON:  None Available. FINDINGS: Normal mediastinum and cardiac silhouette. Normal pulmonary vasculature. No evidence of effusion, infiltrate, or pneumothorax. No acute bony abnormality. IMPRESSION: No acute cardiopulmonary process. Electronically Signed   By: Genevive Bi M.D.   On: 05/17/2023 15:03   DG Knee Complete 4 Views Left Result Date: 05/17/2023 CLINICAL DATA:  Pain, weakness, inability to bear weight EXAM: LEFT KNEE - COMPLETE 4+ VIEW COMPARISON:  None Available. FINDINGS: Frontal, bilateral oblique, and lateral views of the left knee are obtained. No acute  fracture, subluxation, or dislocation. Joint spaces are relatively well preserved. No joint effusion. Extensive atherosclerosis. Soft tissues are unremarkable. IMPRESSION: 1. Unremarkable left knee. 2. Extensive atherosclerosis. Electronically Signed   By: Sharlet Salina M.D.   On: 05/17/2023 15:00    Procedures Procedures    Medications Ordered in ED Medications  sodium chloride 0.9 % bolus 1,000 mL (has no administration in time range)    ED Course/ Medical Decision Making/ A&P Clinical Course as of 05/17/23 1542  Mon May 17, 2023  1528 L leg weakness hx stroke L deficits, can usually ambulate with cane, now not able - shakey and no ambulate - crawls, decrease oral intake [ ]  CT head [ ]  PT/OT eval in ED [HG]    Clinical Course User Index [HG] Renella Cunas, MD                                 Medical Decision Making Amount and/or Complexity of Data Reviewed Labs: ordered. Radiology: ordered.  This patient presents to the ED for  concern of LLE weakness, this involves an extensive number of treatment options, and is a complaint that carries with it a high risk of complications and morbidity.  The differential diagnosis of weakness includes but is not limited to neurologic causes (GBS, myasthenia gravis, CVA, MS, ALS, transverse myelitis, spinal cord injury, CVA, botulism, ) and other causes: ACS, Arrhythmia, syncope, orthostatic hypotension, sepsis, hypoglycemia, electrolyte disturbance, hypothyroidism, respiratory failure, symptomatic anemia, dehydration, heat injury, polypharmacy, malignancy.   My initial workup includes labs, imaging, fluids  Additional history obtained from: Nursing notes from this visit.  I ordered, reviewed and interpreted labs which include: CBC, BMP, CK  I ordered imaging studies including x-ray left knee, chest I independently visualized and interpreted imaging which are pending at shift change  Afebrile, hemodynamically stable.  73 year old male  presenting to the ED for evaluation of left lower extremity weakness.  Symptoms began 3 to 4 days ago.  He states that when he tries to put weight on his leg it shakes and gives out.  He has left-sided weakness due to previous CVA.  He states that he is weaker than normal at this time.  Slightly decreased oral intake.  He reports mild pain to the left knee.  On exam, there is some weakness to the left lower extremity.  Unclear what his baseline is.  Due to decreased oral intake, labs were obtained.  Imaging was obtained as well.  This may be progression of his baseline weakness.  Care handed off pending workup.  May need PT/OT eval if workup is reassuring.  Care handed off to oncoming provider.  Plan may change at the discretion of the oncoming provider.  Please see their note for final disposition and decision-making.  Patient's case discussed with Dr. Karene Fry.   Note: Portions of this report may have been transcribed using voice recognition software. Every effort was made to ensure accuracy; however, inadvertent computerized transcription errors may still be present.        Final Clinical Impression(s) / ED Diagnoses Final diagnoses:  None    Rx / DC Orders ED Discharge Orders     None         Michelle Piper, Cordelia Poche 05/17/23 1542    Ernie Avena, MD 05/17/23 1754

## 2023-05-17 NOTE — ED Notes (Signed)
 Unable to get any blood from patient. EDP Clemon Chambers notified and asked phlebotomy to try.

## 2023-05-17 NOTE — ED Provider Notes (Signed)
  Physical Exam  BP 128/77   Pulse 86   Temp 97.8 F (36.6 C) (Oral)   Resp 18   Ht 5\' 4"  (1.626 m)   Wt 87.1 kg   SpO2 98%   BMI 32.96 kg/m   Physical Exam  Procedures  Procedures  ED Course / MDM   Clinical Course as of 05/17/23 1626  Mon May 17, 2023  1528 L leg weakness hx stroke L deficits, can usually ambulate with cane, now not able - shakey and no ambulate - crawls, decrease oral intake [ ]  CT head [ ]  PT/OT eval in ED [HG]    Clinical Course User Index [HG] Renella Cunas, MD   Medical Decision Making Amount and/or Complexity of Data Reviewed Labs: ordered. Radiology: ordered.  Risk Prescription drug management.   At the time of handoff, I was awaiting CT head as well as PT OT evaluation in the ED.  Please see prior ED providers note for initial HPI and physical exam.  CT head is notable for no acute intracranial abnormality, however shows remote infarcts in the right corona radiata and right basal ganglia with encephalomalacia in the anterior inferior right frontal lobe.  EKG is reassuring as it shows normal sinus rhythm.  PR and QTc intervals appropriate.  QRS slightly prolonged at 128 ms.  There is a left axis deviation.  No prior EKGs to compare.  This EKG does not show STEMI.  IV team was contacted to place IV as RN staff of the ED was unable.  Subsequently, unable to draw labs off IV.  I placed an ultrasound-guided IV at bedside and labs were able to be drawn.  CBC notable for no leukocytosis and stable hemoglobin.  BMP and CK are still pending at the time of handoff as prior samples had been hemolyzed.   Renella Cunas, PGY 2 Emergency medicine    Renella Cunas, MD 05/18/23 2130    Glendora Score, MD 05/18/23 [phone]

## 2023-05-18 NOTE — ED Notes (Signed)
 Daughter refused for staff to push patient out to waiting area after discharge.

## 2023-06-11 DIAGNOSIS — R2681 Unsteadiness on feet: Secondary | ICD-10-CM | POA: Diagnosis not present

## 2023-06-11 DIAGNOSIS — I5022 Chronic systolic (congestive) heart failure: Secondary | ICD-10-CM | POA: Diagnosis not present

## 2023-06-11 DIAGNOSIS — R1312 Dysphagia, oropharyngeal phase: Secondary | ICD-10-CM | POA: Diagnosis not present

## 2023-06-11 DIAGNOSIS — I69891 Dysphagia following other cerebrovascular disease: Secondary | ICD-10-CM | POA: Diagnosis not present

## 2023-06-11 DIAGNOSIS — M109 Gout, unspecified: Secondary | ICD-10-CM | POA: Diagnosis not present

## 2023-06-11 DIAGNOSIS — R2689 Other abnormalities of gait and mobility: Secondary | ICD-10-CM | POA: Diagnosis not present

## 2023-06-11 DIAGNOSIS — I10 Essential (primary) hypertension: Secondary | ICD-10-CM | POA: Diagnosis not present

## 2023-06-11 DIAGNOSIS — E559 Vitamin D deficiency, unspecified: Secondary | ICD-10-CM | POA: Diagnosis not present

## 2023-06-11 DIAGNOSIS — I129 Hypertensive chronic kidney disease with stage 1 through stage 4 chronic kidney disease, or unspecified chronic kidney disease: Secondary | ICD-10-CM | POA: Diagnosis not present

## 2023-06-11 DIAGNOSIS — Z7401 Bed confinement status: Secondary | ICD-10-CM | POA: Diagnosis not present

## 2023-06-11 DIAGNOSIS — R278 Other lack of coordination: Secondary | ICD-10-CM | POA: Diagnosis not present

## 2023-06-11 DIAGNOSIS — M6281 Muscle weakness (generalized): Secondary | ICD-10-CM | POA: Diagnosis not present

## 2023-06-11 DIAGNOSIS — I69354 Hemiplegia and hemiparesis following cerebral infarction affecting left non-dominant side: Secondary | ICD-10-CM | POA: Diagnosis not present

## 2023-06-11 DIAGNOSIS — I639 Cerebral infarction, unspecified: Secondary | ICD-10-CM | POA: Diagnosis not present

## 2023-06-11 DIAGNOSIS — I2510 Atherosclerotic heart disease of native coronary artery without angina pectoris: Secondary | ICD-10-CM | POA: Diagnosis not present

## 2023-06-11 DIAGNOSIS — M8580 Other specified disorders of bone density and structure, unspecified site: Secondary | ICD-10-CM | POA: Diagnosis not present

## 2023-06-11 DIAGNOSIS — M1A9XX0 Chronic gout, unspecified, without tophus (tophi): Secondary | ICD-10-CM | POA: Diagnosis not present

## 2023-06-11 DIAGNOSIS — M79672 Pain in left foot: Secondary | ICD-10-CM | POA: Diagnosis not present

## 2023-06-11 DIAGNOSIS — G4700 Insomnia, unspecified: Secondary | ICD-10-CM | POA: Diagnosis not present

## 2023-06-11 DIAGNOSIS — I7389 Other specified peripheral vascular diseases: Secondary | ICD-10-CM | POA: Diagnosis not present

## 2023-06-11 DIAGNOSIS — K219 Gastro-esophageal reflux disease without esophagitis: Secondary | ICD-10-CM | POA: Diagnosis not present

## 2023-06-11 DIAGNOSIS — R7303 Prediabetes: Secondary | ICD-10-CM | POA: Diagnosis not present

## 2023-06-11 DIAGNOSIS — Z720 Tobacco use: Secondary | ICD-10-CM | POA: Diagnosis not present

## 2023-06-11 DIAGNOSIS — N1832 Chronic kidney disease, stage 3b: Secondary | ICD-10-CM | POA: Diagnosis not present

## 2023-06-14 DIAGNOSIS — R2681 Unsteadiness on feet: Secondary | ICD-10-CM | POA: Diagnosis not present

## 2023-06-14 DIAGNOSIS — R2689 Other abnormalities of gait and mobility: Secondary | ICD-10-CM | POA: Diagnosis not present

## 2023-06-14 DIAGNOSIS — I69354 Hemiplegia and hemiparesis following cerebral infarction affecting left non-dominant side: Secondary | ICD-10-CM | POA: Diagnosis not present

## 2023-06-14 DIAGNOSIS — I5022 Chronic systolic (congestive) heart failure: Secondary | ICD-10-CM | POA: Diagnosis not present

## 2023-06-14 DIAGNOSIS — I7389 Other specified peripheral vascular diseases: Secondary | ICD-10-CM | POA: Diagnosis not present

## 2023-06-14 DIAGNOSIS — Z720 Tobacco use: Secondary | ICD-10-CM | POA: Diagnosis not present

## 2023-06-14 DIAGNOSIS — M6281 Muscle weakness (generalized): Secondary | ICD-10-CM | POA: Diagnosis not present

## 2023-06-17 DIAGNOSIS — Z720 Tobacco use: Secondary | ICD-10-CM | POA: Diagnosis not present

## 2023-06-17 DIAGNOSIS — I69354 Hemiplegia and hemiparesis following cerebral infarction affecting left non-dominant side: Secondary | ICD-10-CM | POA: Diagnosis not present

## 2023-06-17 DIAGNOSIS — R2689 Other abnormalities of gait and mobility: Secondary | ICD-10-CM | POA: Diagnosis not present

## 2023-06-17 DIAGNOSIS — I7389 Other specified peripheral vascular diseases: Secondary | ICD-10-CM | POA: Diagnosis not present

## 2023-06-17 DIAGNOSIS — I639 Cerebral infarction, unspecified: Secondary | ICD-10-CM | POA: Diagnosis not present

## 2023-06-17 DIAGNOSIS — R2681 Unsteadiness on feet: Secondary | ICD-10-CM | POA: Diagnosis not present

## 2023-06-17 DIAGNOSIS — I5022 Chronic systolic (congestive) heart failure: Secondary | ICD-10-CM | POA: Diagnosis not present

## 2023-06-17 DIAGNOSIS — M1A9XX0 Chronic gout, unspecified, without tophus (tophi): Secondary | ICD-10-CM | POA: Diagnosis not present

## 2023-06-17 DIAGNOSIS — N1832 Chronic kidney disease, stage 3b: Secondary | ICD-10-CM | POA: Diagnosis not present

## 2023-06-17 DIAGNOSIS — M79672 Pain in left foot: Secondary | ICD-10-CM | POA: Diagnosis not present

## 2023-06-17 DIAGNOSIS — M6281 Muscle weakness (generalized): Secondary | ICD-10-CM | POA: Diagnosis not present

## 2023-06-17 DIAGNOSIS — I10 Essential (primary) hypertension: Secondary | ICD-10-CM | POA: Diagnosis not present

## 2023-06-21 DIAGNOSIS — R2689 Other abnormalities of gait and mobility: Secondary | ICD-10-CM | POA: Diagnosis not present

## 2023-06-21 DIAGNOSIS — I69354 Hemiplegia and hemiparesis following cerebral infarction affecting left non-dominant side: Secondary | ICD-10-CM | POA: Diagnosis not present

## 2023-06-21 DIAGNOSIS — I5022 Chronic systolic (congestive) heart failure: Secondary | ICD-10-CM | POA: Diagnosis not present

## 2023-06-21 DIAGNOSIS — R2681 Unsteadiness on feet: Secondary | ICD-10-CM | POA: Diagnosis not present

## 2023-06-21 DIAGNOSIS — M6281 Muscle weakness (generalized): Secondary | ICD-10-CM | POA: Diagnosis not present

## 2023-06-21 DIAGNOSIS — Z720 Tobacco use: Secondary | ICD-10-CM | POA: Diagnosis not present

## 2023-06-21 DIAGNOSIS — I7389 Other specified peripheral vascular diseases: Secondary | ICD-10-CM | POA: Diagnosis not present

## 2023-06-24 DIAGNOSIS — M6281 Muscle weakness (generalized): Secondary | ICD-10-CM | POA: Diagnosis not present

## 2023-06-24 DIAGNOSIS — Z720 Tobacco use: Secondary | ICD-10-CM | POA: Diagnosis not present

## 2023-06-24 DIAGNOSIS — I69354 Hemiplegia and hemiparesis following cerebral infarction affecting left non-dominant side: Secondary | ICD-10-CM | POA: Diagnosis not present

## 2023-06-24 DIAGNOSIS — I5022 Chronic systolic (congestive) heart failure: Secondary | ICD-10-CM | POA: Diagnosis not present

## 2023-06-24 DIAGNOSIS — R2681 Unsteadiness on feet: Secondary | ICD-10-CM | POA: Diagnosis not present

## 2023-06-24 DIAGNOSIS — R2689 Other abnormalities of gait and mobility: Secondary | ICD-10-CM | POA: Diagnosis not present

## 2023-06-24 DIAGNOSIS — I7389 Other specified peripheral vascular diseases: Secondary | ICD-10-CM | POA: Diagnosis not present

## 2023-06-25 DIAGNOSIS — I639 Cerebral infarction, unspecified: Secondary | ICD-10-CM | POA: Diagnosis not present

## 2023-06-25 DIAGNOSIS — Z720 Tobacco use: Secondary | ICD-10-CM | POA: Diagnosis not present

## 2023-06-25 DIAGNOSIS — N1832 Chronic kidney disease, stage 3b: Secondary | ICD-10-CM | POA: Diagnosis not present

## 2023-06-25 DIAGNOSIS — M6281 Muscle weakness (generalized): Secondary | ICD-10-CM | POA: Diagnosis not present

## 2023-06-25 DIAGNOSIS — I5022 Chronic systolic (congestive) heart failure: Secondary | ICD-10-CM | POA: Diagnosis not present

## 2023-06-25 DIAGNOSIS — M1A9XX0 Chronic gout, unspecified, without tophus (tophi): Secondary | ICD-10-CM | POA: Diagnosis not present

## 2023-06-25 DIAGNOSIS — I10 Essential (primary) hypertension: Secondary | ICD-10-CM | POA: Diagnosis not present

## 2023-08-19 ENCOUNTER — Other Ambulatory Visit: Payer: Self-pay

## 2023-08-19 ENCOUNTER — Encounter (HOSPITAL_COMMUNITY): Payer: Self-pay | Admitting: Emergency Medicine

## 2023-08-19 ENCOUNTER — Emergency Department (HOSPITAL_COMMUNITY)
Admission: EM | Admit: 2023-08-19 | Discharge: 2023-08-19 | Disposition: A | Discharge status: Home / Self Care | Attending: Emergency Medicine | Admitting: Emergency Medicine

## 2023-08-19 DIAGNOSIS — I2510 Atherosclerotic heart disease of native coronary artery without angina pectoris: Secondary | ICD-10-CM | POA: Insufficient documentation

## 2023-08-19 DIAGNOSIS — Z7901 Long term (current) use of anticoagulants: Secondary | ICD-10-CM | POA: Insufficient documentation

## 2023-08-19 DIAGNOSIS — R195 Other fecal abnormalities: Secondary | ICD-10-CM | POA: Insufficient documentation

## 2023-08-19 LAB — CBC WITH DIFFERENTIAL/PLATELET
Abs Immature Granulocytes: 0.02 10*3/uL (ref 0.00–0.07)
Basophils Absolute: 0 10*3/uL (ref 0.0–0.1)
Basophils Relative: 1 %
Eosinophils Absolute: 0.3 10*3/uL (ref 0.0–0.5)
Eosinophils Relative: 5 %
HCT: 44 % (ref 39.0–52.0)
Hemoglobin: 14.1 g/dL (ref 13.0–17.0)
Immature Granulocytes: 0 %
Lymphocytes Relative: 26 %
Lymphs Abs: 1.8 10*3/uL (ref 0.7–4.0)
MCH: 27.1 pg (ref 26.0–34.0)
MCHC: 32 g/dL (ref 30.0–36.0)
MCV: 84.6 fL (ref 80.0–100.0)
Monocytes Absolute: 0.6 10*3/uL (ref 0.1–1.0)
Monocytes Relative: 10 %
Neutro Abs: 3.9 10*3/uL (ref 1.7–7.7)
Neutrophils Relative %: 58 %
Platelets: 160 10*3/uL (ref 150–400)
RBC: 5.2 MIL/uL (ref 4.22–5.81)
RDW: 15.4 % (ref 11.5–15.5)
WBC: 6.6 10*3/uL (ref 4.0–10.5)
nRBC: 0 % (ref 0.0–0.2)

## 2023-08-19 LAB — CBC
HCT: 43.2 % (ref 39.0–52.0)
Hemoglobin: 13.8 g/dL (ref 13.0–17.0)
MCH: 27.1 pg (ref 26.0–34.0)
MCHC: 31.9 g/dL (ref 30.0–36.0)
MCV: 84.9 fL (ref 80.0–100.0)
Platelets: 153 10*3/uL (ref 150–400)
RBC: 5.09 MIL/uL (ref 4.22–5.81)
RDW: 15.5 % (ref 11.5–15.5)
WBC: 6.2 10*3/uL (ref 4.0–10.5)
nRBC: 0 % (ref 0.0–0.2)

## 2023-08-19 LAB — COMPREHENSIVE METABOLIC PANEL WITH GFR
ALT: 13 U/L (ref 0–44)
AST: 21 U/L (ref 15–41)
Albumin: 3.7 g/dL (ref 3.5–5.0)
Alkaline Phosphatase: 67 U/L (ref 38–126)
Anion gap: 11 (ref 5–15)
BUN: 8 mg/dL (ref 8–23)
CO2: 19 mmol/L — ABNORMAL LOW (ref 22–32)
Calcium: 9.3 mg/dL (ref 8.9–10.3)
Chloride: 110 mmol/L (ref 98–111)
Creatinine, Ser: 1.31 mg/dL — ABNORMAL HIGH (ref 0.61–1.24)
GFR, Estimated: 58 mL/min — ABNORMAL LOW (ref >60)
Glucose, Bld: 113 mg/dL — ABNORMAL HIGH (ref 70–99)
Potassium: 4.1 mmol/L (ref 3.5–5.1)
Sodium: 140 mmol/L (ref 135–145)
Total Bilirubin: 0.8 mg/dL (ref 0.0–1.2)
Total Protein: 7.3 g/dL (ref 6.5–8.1)

## 2023-08-19 LAB — TYPE AND SCREEN
ABO/RH(D): B POS
Antibody Screen: NEGATIVE

## 2023-08-19 LAB — POC OCCULT BLOOD, ED: Fecal Occult Bld: NEGATIVE

## 2023-08-19 MED ORDER — PANTOPRAZOLE SODIUM 40 MG PO TBEC
40.0000 mg | DELAYED_RELEASE_TABLET | Freq: Once | ORAL | Status: AC
  Administered 2023-08-19: 40 mg via ORAL
  Filled 2023-08-19: qty 1

## 2023-08-19 MED ORDER — LOPERAMIDE HCL 2 MG PO CAPS
4.0000 mg | ORAL_CAPSULE | Freq: Once | ORAL | Status: AC
  Administered 2023-08-19: 4 mg via ORAL
  Filled 2023-08-19: qty 2

## 2023-08-19 MED ORDER — PANTOPRAZOLE SODIUM 20 MG PO TBEC: 20.0000 mg | DELAYED_RELEASE_TABLET | Freq: Every day | ORAL | 0 refills | Status: AC

## 2023-08-19 NOTE — ED Provider Notes (Signed)
  EMERGENCY DEPARTMENT AT [facility] Provider Note   CSN: [id number] Arrival date & time: 08/19/23  1100     Patient presents with: GI Bleeding   Andrew Calhoun is a 73 y.o. male history of stroke on Eliquis, left hemiparesis, CAD, here presenting with dark stools and abdominal cramps.  Patient has been having, cramps for the last 2 days.  Patient has been noticing some dark stools.  Patient has been taking Pepto-Bismol.  Patient denies any frank blood in his stool.  Denies any vomiting or fever.  Patient [facility] at [facility]   The history is provided by the patient.       Prior to Admission medications   Medication Sig Start Date End Date Taking? Authorizing Provider  HYDROcodone -acetaminophen  (NORCO/VICODIN) 5-325 MG tablet Take 1 tablet by mouth every 4 (four) hours as needed. 05/19/17   Trish Furl, MD  methocarbamol  (ROBAXIN ) 500 MG tablet Take 1 tablet (500 mg total) by mouth 2 (two) times daily. 05/17/23   Sherel Dikes, PA-C  ondansetron  (ZOFRAN  ODT) 8 MG disintegrating tablet Take 1 tablet (8 mg total) by mouth every 8 (eight) hours as needed for nausea or vomiting. 05/19/17   Trish Furl, MD    Allergies: Patient has no known allergies.    Review of Systems  Gastrointestinal:        Dark stool  All other systems reviewed and are negative.   Updated Vital Signs BP (!) 122/90 (BP Location: Right Arm)   Pulse 79   Temp 98.5 F (36.9 C)   Resp 17   SpO2 99%   Physical Exam Vitals and nursing note reviewed.  Constitutional:      Appearance: Normal appearance.  HENT:     Head: Normocephalic.     Nose: Nose normal.     Mouth/Throat:     Mouth: Mucous membranes are moist.   Eyes:     Extraocular Movements: Extraocular movements intact.     Pupils: Pupils are equal, round, and reactive to light.    Cardiovascular:     Rate and Rhythm: Normal rate and regular rhythm.     Pulses: Normal pulses.     Heart sounds: Normal heart sounds.   Pulmonary:     Effort: Pulmonary effort is normal.     Breath sounds: Normal breath sounds.  Abdominal:     General: Abdomen is flat.     Palpations: Abdomen is soft.  Genitourinary:    Comments: Rectal-brownish to dark stool.  No blood in the rectum.  No hemorrhoids  Musculoskeletal:        General: Normal range of motion.     Cervical back: Normal range of motion and neck supple.   Skin:    General: Skin is warm.     Capillary Refill: Capillary refill takes less than 2 seconds.   Neurological:     General: No focal deficit present.     Mental Status: He is alert and oriented to person, place, and time.     Comments: Left hemiparesis which is chronic  Psychiatric:        Mood and Affect: Mood normal.        Behavior: Behavior normal.     (all labs ordered are listed, but only abnormal results are displayed) Labs Reviewed  COMPREHENSIVE METABOLIC PANEL WITH GFR - Abnormal; Notable for the following components:      Result Value   CO2 19 (*)    Glucose, Bld 113 (*)  Creatinine, Ser 1.31 (*)    GFR, Estimated 58 (*)    All other components within normal limits  CBC  CBC WITH DIFFERENTIAL/PLATELET  POC OCCULT BLOOD, ED  TYPE AND SCREEN    EKG: None  Radiology: No results found.   Procedures   Medications Ordered in the ED - No data to display                                  Medical Decision Making Andrew Calhoun is a 73 y.o. male history of stroke on Eliquis here presenting with dark stool.  Patient is also taking Pepto-Bismol so could be side effect of Pepto.  Consider slow GI bleed as well.  Plan to do rectal exam and check CBC and CMP  8:52 PM Initial hemoglobin was 13.  Repeat was 14 and guaiac is negative.  I wonder if this is side effect of Pepto-Bismol.  I recommend that he take Protonix and follow-up with GI at [facility]   Problems Addressed: Dark stools: acute illness or injury  Amount and/or Complexity of Data Reviewed Labs: ordered.  Decision-making details documented in ED Course.     Final diagnoses:  None    ED Discharge Orders     None          Dalene Duck, MD 08/19/23 2053

## 2023-08-19 NOTE — ED Triage Notes (Signed)
 PT reports abdominal cramping and dark colored stool that started 2 days ago. PT also reports feeling lightheaded. Pt taking eliquis due to previous stroke.

## 2023-08-19 NOTE — Discharge Instructions (Addendum)
 As we discussed, you do not have any blood in your stool and your blood count is normal today  I recommend you take Protonix 20 mg daily to help protect your stomach  You likely have side effect of Pepto-Bismol  You can take Imodium as needed for diarrhea  You should follow-up with your doctor at [facility]  Return to ER if you have severe abdominal pain or vomiting or blood in your stool
